# Patient Record
Sex: Female | Born: 1965 | Race: White | Hispanic: No | Marital: Married | Smoking: Never smoker
Health system: Southern US, Community
[De-identification: ages and names within clinical notes are randomized; demographics above are authoritative.]

## PROBLEM LIST (undated history)

## (undated) HISTORY — PX: OTHER SURGICAL HISTORY: SHX169

## (undated) HISTORY — PX: BUNIONECTOMY: SHX129

---

## 2002-07-15 ENCOUNTER — Other Ambulatory Visit: Admission: RE | Admit: 2002-07-15 | Discharge: 2002-07-15 | Payer: Self-pay | Admitting: Obstetrics and Gynecology

## 2003-11-18 ENCOUNTER — Other Ambulatory Visit: Admission: RE | Admit: 2003-11-18 | Discharge: 2003-11-18 | Payer: Self-pay | Admitting: Obstetrics and Gynecology

## 2004-06-20 ENCOUNTER — Ambulatory Visit (HOSPITAL_COMMUNITY): Admission: RE | Admit: 2004-06-20 | Discharge: 2004-06-20 | Payer: Self-pay | Admitting: Obstetrics and Gynecology

## 2005-02-28 ENCOUNTER — Ambulatory Visit (HOSPITAL_COMMUNITY): Admission: RE | Admit: 2005-02-28 | Discharge: 2005-02-28 | Payer: Self-pay | Admitting: Specialist

## 2011-03-17 ENCOUNTER — Ambulatory Visit: Payer: Private Health Insurance - Indemnity | Admitting: Family Medicine

## 2011-03-17 VITALS — BP 128/67 | HR 52 | Temp 98.2°F | Resp 16 | Ht 66.38 in | Wt 138.4 lb

## 2011-03-17 DIAGNOSIS — J329 Chronic sinusitis, unspecified: Secondary | ICD-10-CM

## 2011-03-17 DIAGNOSIS — J029 Acute pharyngitis, unspecified: Secondary | ICD-10-CM

## 2011-03-17 LAB — POCT RAPID STREP A (OFFICE): Rapid Strep A Screen: NEGATIVE

## 2011-03-17 MED ORDER — AZITHROMYCIN 250 MG PO TABS: ORAL_TABLET | ORAL | Status: AC

## 2011-03-17 NOTE — Progress Notes (Signed)
  [facility]:  Office Visit  Chief Complaint:  Chief Complaint  Patient presents with  . Sore Throat    x 2  week  left side   . Ear Fullness    x  10 days   right is worse  . Sinusitis        advil, dayquil sudafed, nyquil    HPI: Stefanie Blankenship is a 46 y.o. female who complains of  Sore throat, earch ache, sinusitis x 2 weeks, was improving but now worse.   History reviewed. No pertinent past medical history. Past Surgical History  Procedure Date  . Bunionectomy   . Tubal adhesion    History reviewed. No pertinent family history. Allergies  Allergen Reactions  . Penicillins     hospitalized  . Erythromycin Rash   Prior to Admission medications   Not on File     ROS: The patient denies fevers, chills, night sweats, unintentional weight loss, chest pain, palpitations, wheezing, dyspnea on exertion, nausea, vomiting, abdominal pain, dysuria, hematuria, melena, numbness, weakness, or tingling.   All other systems have been reviewed and were otherwise negative with the exception of those mentioned in the HPI and as above.    PHYSICAL EXAM: Filed Vitals:   03/17/11 1409  BP: 128/67  Pulse: 52  Temp: 98.2 F (36.8 C)  Resp: 16   Filed Vitals:   03/17/11 1409  Height: 5' 6.38" (1.686 m)  Weight: 138 lb 6.4 oz (62.778 kg)   Body mass index is 22.08 kg/(m^2).  General: Alert, no acute distress HEENT:  Normocephalic, atraumatic, oropharynx patent.Tm normal, + sinus pressure maxillary, + erythematous, normal size  tonsils. Cardiovascular:  Regular rate and rhythm, no rubs murmurs or gallops.  No Carotid bruits, radial pulse intact. No pedal edema.  Respiratory: Clear to auscultation bilaterally.  No wheezes, rales, or rhonchi.  No cyanosis, no use of accessory musculature GI: No organomegaly, abdomen is soft and non-tender, positive bowel sounds.  No masses. Skin: No rashes. Neurologic: Facial musculature symmetric. Psychiatric: Patient is appropriate  throughout our interaction. Lymphatic: No cervical lymphadenopathy Musculoskeletal: Gait intact  LABS: Results for orders placed in visit on 03/17/11  POCT RAPID STREP A (OFFICE)      Component Value Range   Rapid Strep A Screen Negative  Negative       ASSESSMENT/PLAN: Encounter Diagnoses  Name Primary?  . Pharyngitis Yes  . Sinusitis    1. OTC sxs treatment, salt water gargles 2. Rx Z-pack    Benton Tooker PHUONG, DO 03/19/2011 9:10 AM

## 2016-08-16 ENCOUNTER — Ambulatory Visit (INDEPENDENT_AMBULATORY_CARE_PROVIDER_SITE_OTHER): Payer: 59 | Admitting: Internal Medicine

## 2016-08-16 DIAGNOSIS — Z7189 Other specified counseling: Secondary | ICD-10-CM

## 2016-08-16 DIAGNOSIS — Z7184 Encounter for health counseling related to travel: Secondary | ICD-10-CM

## 2016-08-16 DIAGNOSIS — Z9189 Other specified personal risk factors, not elsewhere classified: Secondary | ICD-10-CM | POA: Diagnosis not present

## 2016-08-16 DIAGNOSIS — Z23 Encounter for immunization: Secondary | ICD-10-CM

## 2016-08-16 DIAGNOSIS — Z789 Other specified health status: Secondary | ICD-10-CM

## 2016-08-16 MED ORDER — ACETAZOLAMIDE 125 MG PO TABS: 125.0000 mg | ORAL_TABLET | Freq: Two times a day (BID) | ORAL | 0 refills | Status: DC

## 2016-08-16 MED ORDER — AZITHROMYCIN 500 MG PO TABS: 500.0000 mg | ORAL_TABLET | Freq: Every day | ORAL | 0 refills | Status: DC

## 2016-08-16 NOTE — Progress Notes (Signed)
Subjective:   Stefanie Blankenship is a 51 y.o. female who presents to the Infectious Disease clinic for travel consultation. Planned departure date: August 4th Planned return date: august 16th Countries of travel: [place] Areas in country: urban and rural Accommodations: hotel and camping Purpose of travel: vacation Prior travel out of [place]: yes- [place], [place], and [place]    Objective:   Medications: none    Assessment:   No contraindications to travel. none   Plan:    Pre-travel vaccination = will give tdap, hep A #1, and typhoid vaccine. Discussed that it does not likely need yellow fever vaccine based on hiking trail.  Malaria proph = not needed, winter, not in endemic area  Traveler's diarrhea = gave precautions, and also rx for azithromycin  Altitude sickness prevention = will give diamox 125mg  bid, to start the day prior to ascent until 2 days at altitude. Gave extra tabs in case symptomatic at [place]

## 2017-10-22 ENCOUNTER — Ambulatory Visit (INDEPENDENT_AMBULATORY_CARE_PROVIDER_SITE_OTHER): Payer: 59 | Admitting: Family Medicine

## 2017-10-22 ENCOUNTER — Encounter: Payer: Self-pay | Admitting: Family Medicine

## 2017-10-22 ENCOUNTER — Other Ambulatory Visit: Payer: Self-pay

## 2017-10-22 VITALS — BP 134/73 | HR 60 | Ht 66.38 in | Wt 152.4 lb

## 2017-10-22 DIAGNOSIS — N390 Urinary tract infection, site not specified: Secondary | ICD-10-CM

## 2017-10-22 DIAGNOSIS — R319 Hematuria, unspecified: Secondary | ICD-10-CM

## 2017-10-22 DIAGNOSIS — R350 Frequency of micturition: Secondary | ICD-10-CM | POA: Diagnosis not present

## 2017-10-22 LAB — POCT URINALYSIS DIP (MANUAL ENTRY)
BILIRUBIN UA: NEGATIVE
BILIRUBIN UA: NEGATIVE mg/dL
GLUCOSE UA: NEGATIVE mg/dL
Nitrite, UA: NEGATIVE
Protein Ur, POC: NEGATIVE mg/dL
SPEC GRAV UA: 1.01 (ref 1.010–1.025)
Urobilinogen, UA: 0.2 E.U./dL
pH, UA: 7 (ref 5.0–8.0)

## 2017-10-22 LAB — POC MICROSCOPIC URINALYSIS (UMFC): Mucus: ABSENT

## 2017-10-22 MED ORDER — PHENAZOPYRIDINE HCL 100 MG PO TABS: 100.0000 mg | ORAL_TABLET | Freq: Three times a day (TID) | ORAL | 0 refills | Status: AC | PRN

## 2017-10-22 MED ORDER — NITROFURANTOIN MONOHYD MACRO 100 MG PO CAPS: 100.0000 mg | ORAL_CAPSULE | Freq: Two times a day (BID) | ORAL | 0 refills | Status: AC

## 2017-10-22 NOTE — Progress Notes (Signed)
Subjective:  By signing my name below, I, [name], attest that this documentation has been prepared under the direction and in the presence of [name], MD. Electronically Signed: [name], Scribe. 10/22/2017 , 12:29 PM .  Patient was seen in Room 1 .   Patient ID: Stefanie Blankenship, female    DOB: 1965/05/05, 52 y.o.   MRN: [id number] Chief Complaint  Patient presents with  . Polyuria    feeling as though she is not able to empty bladder, urinating more than usual. Urine looking really cloudy   HPI Stefanie Blankenship is a 51 y.o. female  Patient suspects having an UTI, where she's been having a sensation of not fully emptying her bladder after urinating. She's also noticed cloudy urine; although, she's been traveling the past 2 days and returned yesterday. She hasn't tried any treatments for this. She notes this may be her second UTI in her life. She denies any fever, but does feel a little "funky." She denies chills, nausea, vomiting or difficulty eating/drinking. She is allergic to penicillin and erythromycin per allergy list.   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 She has a dog and 2 cats at home.   There are no active problems to display for this patient.  History reviewed. No pertinent past medical history. Past Surgical History:  Procedure Laterality Date  . BUNIONECTOMY    . tubal adhesion     Allergies  Allergen Reactions  . Penicillins     hospitalized  . Erythromycin Rash   Prior to Admission medications   Medication Sig Start Date End Date Taking? Authorizing Provider  diphenhydrAMINE (BENADRYL) 12.5 MG/5ML elixir Take by mouth 4 (four) times daily as needed.   Yes [provider]   Social History   Socioeconomic History  . Marital status: Single    Spouse name: Not on file  . Number of children: Not on file  . Years of education: Not on file  . Highest education  level: Not on file  Occupational History  . Not on file  Social Needs  . Financial resource strain: Not on file  . Food insecurity:    Worry: Not on file    Inability: Not on file  . Transportation needs:    Medical: Not on file    Non-medical: Not on file  Tobacco Use  . Smoking status: Never Smoker  . Smokeless tobacco: Never Used  Substance and Sexual Activity  . Alcohol use: No  . Drug use: No  . Sexual activity: Not on file  Lifestyle  . Physical activity:    Days per week: Not on file    Minutes per session: Not on file  . Stress: Not on file  Relationships  . Social connections:    Talks on phone: Not on file    Gets together: Not on file    Attends religious service: Not on file    Active member of club or organization: Not on file    Attends meetings of clubs or organizations: Not on file    Relationship status: Not on file  . Intimate partner violence:    Fear of current or ex partner: Not on file    Emotionally abused: Not on file    Physically abused: Not on file    Forced sexual activity: Not on file  Other Topics Concern  . Not on file  Social History Narrative  .  Not on file   Review of Systems  Constitutional: Negative for appetite change, chills, fatigue, fever and unexpected weight change.  Respiratory: Negative for cough.   Gastrointestinal: Negative for abdominal pain, constipation, diarrhea, nausea and vomiting.  Endocrine: Positive for polyuria.  Genitourinary: Positive for frequency.       Cloudy urine  Skin: Negative for rash and wound.  Neurological: Negative for dizziness, weakness and headaches.       Objective:   Physical Exam  Constitutional: She is oriented to person, place, and time. She appears well-developed and well-nourished. No distress.  HENT:  Head: Normocephalic and atraumatic.  Eyes: Pupils are equal, round, and reactive to light. EOM are normal.  Neck: Neck supple.  Cardiovascular: Normal rate.  Pulmonary/Chest:  Effort normal. No respiratory distress.  Abdominal: Soft. There is no tenderness. There is no CVA tenderness.  Musculoskeletal: Normal range of motion.  Neurological: She is alert and oriented to person, place, and time.  Skin: Skin is warm and dry.  Psychiatric: She has a normal mood and affect. Her behavior is normal.  Nursing note and vitals reviewed.   Vitals:   10/22/17 1144  BP: 134/73  Pulse: 60  SpO2: 99%  Weight: 152 lb 6.4 oz (69.1 kg)  Height: 5' 6.38" (1.686 m)   Results for orders placed or performed in visit on 10/22/17  POCT urinalysis dipstick  Result Value Ref Range   Color, UA yellow yellow   Clarity, UA cloudy (A) clear   Glucose, UA negative negative mg/dL   Bilirubin, UA negative negative   Ketones, POC UA negative negative mg/dL   Spec Grav, UA 1.610 9.604 - 1.025   Blood, UA moderate (A) negative   pH, UA 7.0 5.0 - 8.0   Protein Ur, POC negative negative mg/dL   Urobilinogen, UA 0.2 0.2 or 1.0 E.U./dL   Nitrite, UA Negative Negative   Leukocytes, UA Large (3+) (A) Negative  POCT Microscopic Urinalysis ([facility])  Result Value Ref Range   WBC,UR,HPF,POC Too numerous to count  (A) None WBC/hpf   RBC,UR,HPF,POC Few (A) None RBC/hpf   Bacteria Many (A) None, Too numerous to count   Mucus Absent Absent   Epithelial Cells, UR Per Microscopy None None, Too numerous to count cells/hpf        Assessment & Plan:    Stefanie Blankenship is a 52 y.o. female Urinary tract infection with hematuria, site unspecified - Plan: nitrofurantoin, macrocrystal-monohydrate, (MACROBID) 100 MG capsule, Urine Culture, phenazopyridine (PYRIDIUM) 100 MG tablet  Urinary frequency - Plan: POCT urinalysis dipstick, POCT Microscopic Urinalysis ([facility]), nitrofurantoin, macrocrystal-monohydrate, (MACROBID) 100 MG capsule, Urine Culture, phenazopyridine (PYRIDIUM) 100 MG tablet  UTI without signs/symptoms of upper tract infection at this time.  -Start Macrobid, Pyridium if needed, push fluids,  check urine culture, RTC precautions given  Meds ordered this encounter  Medications  . nitrofurantoin, macrocrystal-monohydrate, (MACROBID) 100 MG capsule    Sig: Take 1 capsule (100 mg total) by mouth 2 (two) times daily.    Dispense:  14 capsule    Refill:  0  . phenazopyridine (PYRIDIUM) 100 MG tablet    Sig: Take 1 tablet (100 mg total) by mouth 3 (three) times daily as needed for pain.    Dispense:  10 tablet    Refill:  0   Patient Instructions    Start Macrobid twice per day for urinary tract infection.  Make sure to drink plenty of fluids.  Pyridium prescribed if needed for urinary symptoms.  I will  check a urine culture and let you know if any changes needed to your antibiotic regimen.  Thank you for coming in today.  Return to the clinic or go to the nearest emergency room if any of your symptoms worsen or new symptoms occur.   Urinary Tract Infection, Adult A urinary tract infection (UTI) is an infection of any part of the urinary tract. The urinary tract includes the:  Kidneys.  Ureters.  Bladder.  Urethra.  These organs make, store, and get rid of pee (urine) in the body. Follow these instructions at home:  Take over-the-counter and prescription medicines only as told by your doctor.  If you were prescribed an antibiotic medicine, take it as told by your doctor. Do not stop taking the antibiotic even if you start to feel better.  Avoid the following drinks: ? Alcohol. ? Caffeine. ? Tea. ? Carbonated drinks.  Drink enough fluid to keep your pee clear or pale yellow.  Keep all follow-up visits as told by your doctor. This is important.  Make sure to: ? Empty your bladder often and completely. Do not to hold pee for long periods of time. ? Empty your bladder before and after sex. ? Wipe from front to back after a bowel movement if you are female. Use each tissue one time when you wipe. Contact a doctor if:  You have back pain.  You have a  fever.  You feel sick to your stomach (nauseous).  You throw up (vomit).  Your symptoms do not get better after 3 days.  Your symptoms go away and then come back. Get help right away if:  You have very bad back pain.  You have very bad lower belly (abdominal) pain.  You are throwing up and cannot keep down any medicines or water. This information is not intended to replace advice given to you by your health care provider. Make sure you discuss any questions you have with your health care provider. Document Released: 06/26/2007 Document Revised: 06/15/2015 Document Reviewed: 11/28/2014 Elsevier Interactive Patient Education  [employer].    If you have lab work done today you will be contacted with your lab results within the next 2 weeks.  If you have not heard from [place] then please contact us. The fastest way to get your results is to register for My Chart.   IF you received an x-ray today, you will receive an invoice from [facility]. Please contact [facility] at [phone] with questions or concerns regarding your invoice.   IF you received labwork today, you will receive an invoice from [place]. Please contact [facility] at [phone] with questions or concerns regarding your invoice.   Our billing staff will not be able to assist you with questions regarding bills from these companies.  You will be contacted with the lab results as soon as they are available. The fastest way to get your results is to activate your My Chart account. Instructions are located on the last page of this paperwork. If you have not heard from [place] regarding the results in 2 weeks, please contact this office.      I personally performed the services described in this documentation, which was scribed in my presence. The recorded information has been reviewed and considered for accuracy and completeness, addended by me as needed, and agree with information above.  Signed,    [name], MD [facility].  10/23/17 1:00 PM

## 2017-10-22 NOTE — Patient Instructions (Addendum)
  Start Macrobid twice per day for urinary tract infection.  Make sure to drink plenty of fluids.  Pyridium prescribed if needed for urinary symptoms.  I will check a urine culture and let you know if any changes needed to your antibiotic regimen.  Thank you for coming in today.  Return to the clinic or go to the nearest emergency room if any of your symptoms worsen or new symptoms occur.   Urinary Tract Infection, Adult A urinary tract infection (UTI) is an infection of any part of the urinary tract. The urinary tract includes the:  Kidneys.  Ureters.  Bladder.  Urethra.  These organs make, store, and get rid of pee (urine) in the body. Follow these instructions at home:  Take over-the-counter and prescription medicines only as told by your doctor.  If you were prescribed an antibiotic medicine, take it as told by your doctor. Do not stop taking the antibiotic even if you start to feel better.  Avoid the following drinks: ? Alcohol. ? Caffeine. ? Tea. ? Carbonated drinks.  Drink enough fluid to keep your pee clear or pale yellow.  Keep all follow-up visits as told by your doctor. This is important.  Make sure to: ? Empty your bladder often and completely. Do not to hold pee for long periods of time. ? Empty your bladder before and after sex. ? Wipe from front to back after a bowel movement if you are female. Use each tissue one time when you wipe. Contact a doctor if:  You have back pain.  You have a fever.  You feel sick to your stomach (nauseous).  You throw up (vomit).  Your symptoms do not get better after 3 days.  Your symptoms go away and then come back. Get help right away if:  You have very bad back pain.  You have very bad lower belly (abdominal) pain.  You are throwing up and cannot keep down any medicines or water. This information is not intended to replace advice given to you by your health care provider. Make sure you discuss any questions you  have with your health care provider. Document Released: 06/26/2007 Document Revised: 06/15/2015 Document Reviewed: 11/28/2014 Elsevier Interactive Patient Education  [employer].    If you have lab work done today you will be contacted with your lab results within the next 2 weeks.  If you have not heard from [place] then please contact us. The fastest way to get your results is to register for My Chart.   IF you received an x-ray today, you will receive an invoice from [facility]. Please contact [facility] at [phone] with questions or concerns regarding your invoice.   IF you received labwork today, you will receive an invoice from [place]. Please contact [facility] at [phone] with questions or concerns regarding your invoice.   Our billing staff will not be able to assist you with questions regarding bills from these companies.  You will be contacted with the lab results as soon as they are available. The fastest way to get your results is to activate your My Chart account. Instructions are located on the last page of this paperwork. If you have not heard from [place] regarding the results in 2 weeks, please contact this office.

## 2017-10-24 LAB — URINE CULTURE

## 2018-01-01 ENCOUNTER — Other Ambulatory Visit: Payer: Self-pay | Admitting: Family Medicine

## 2018-01-01 DIAGNOSIS — Z1231 Encounter for screening mammogram for malignant neoplasm of breast: Secondary | ICD-10-CM

## 2018-02-09 ENCOUNTER — Ambulatory Visit: Payer: 59

## 2018-02-16 ENCOUNTER — Ambulatory Visit
Admission: RE | Admit: 2018-02-16 | Discharge: 2018-02-16 | Disposition: A | Discharge status: Home / Self Care | Payer: 59 | Source: Ambulatory Visit | Attending: Family Medicine | Admitting: Family Medicine

## 2018-02-16 DIAGNOSIS — Z1231 Encounter for screening mammogram for malignant neoplasm of breast: Secondary | ICD-10-CM

## 2020-06-12 IMAGING — MG DIGITAL SCREENING BILATERAL MAMMOGRAM WITH TOMO AND CAD
8 series · 9 of 24 positions shown · non-contrast
Comparison: None.

CLINICAL DATA: Screening.

EXAM:
DIGITAL SCREENING BILATERAL MAMMOGRAM WITH TOMO AND CAD

[R MLO synth-2D]
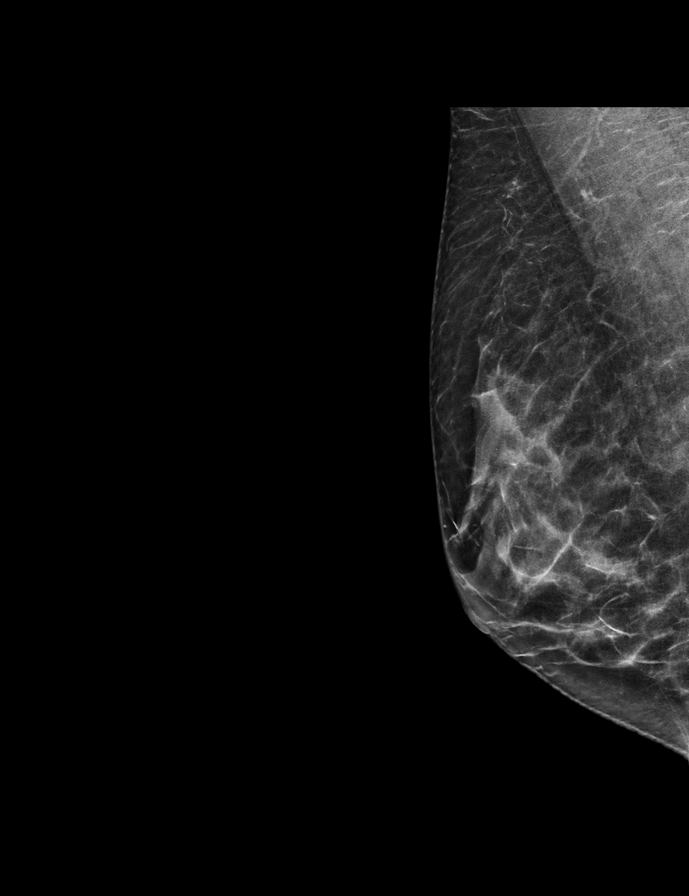

[R CC synth-2D]
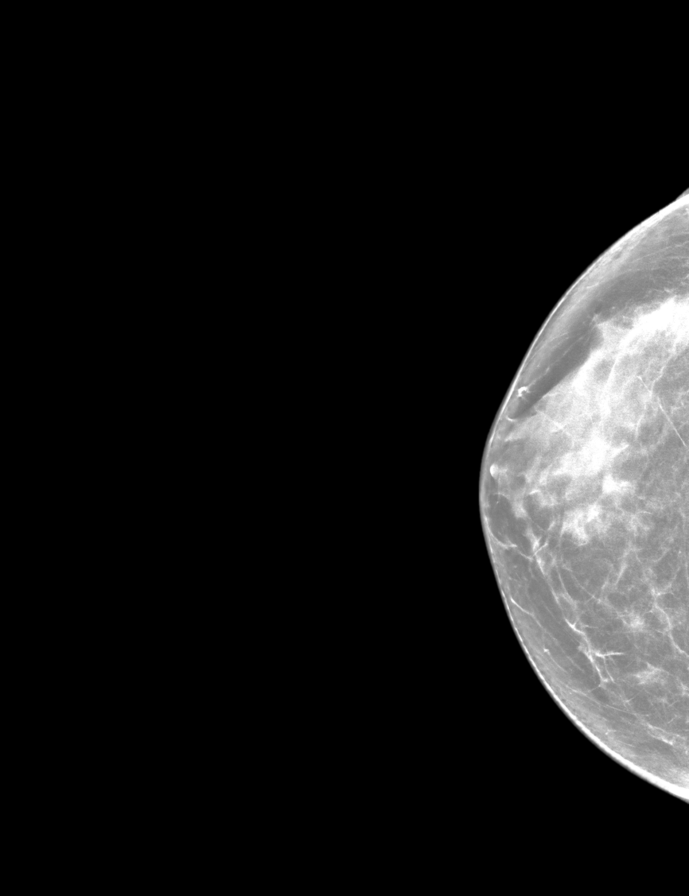

[L CC synth-2D]
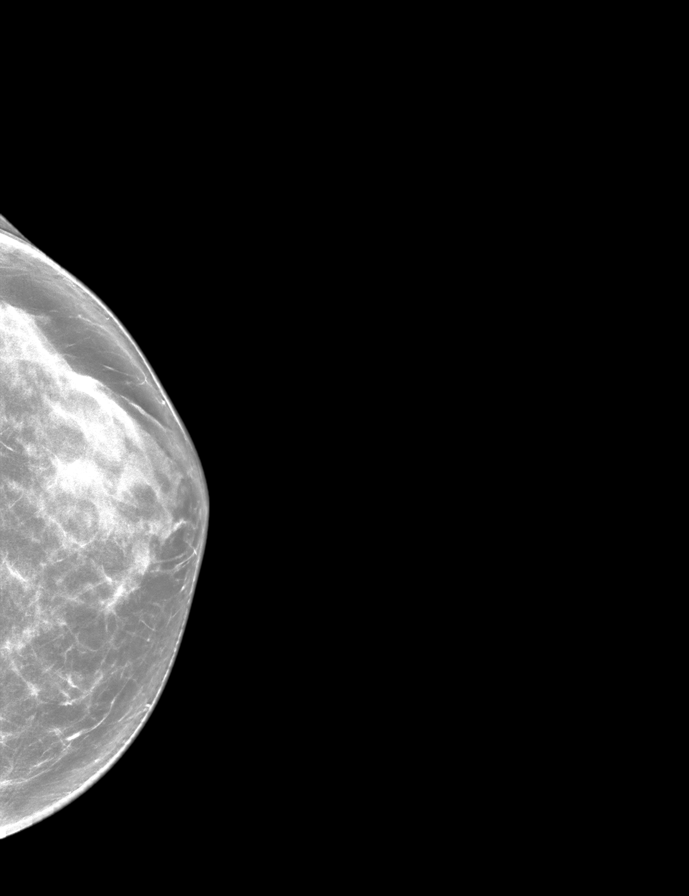

[L MLO synth-2D]
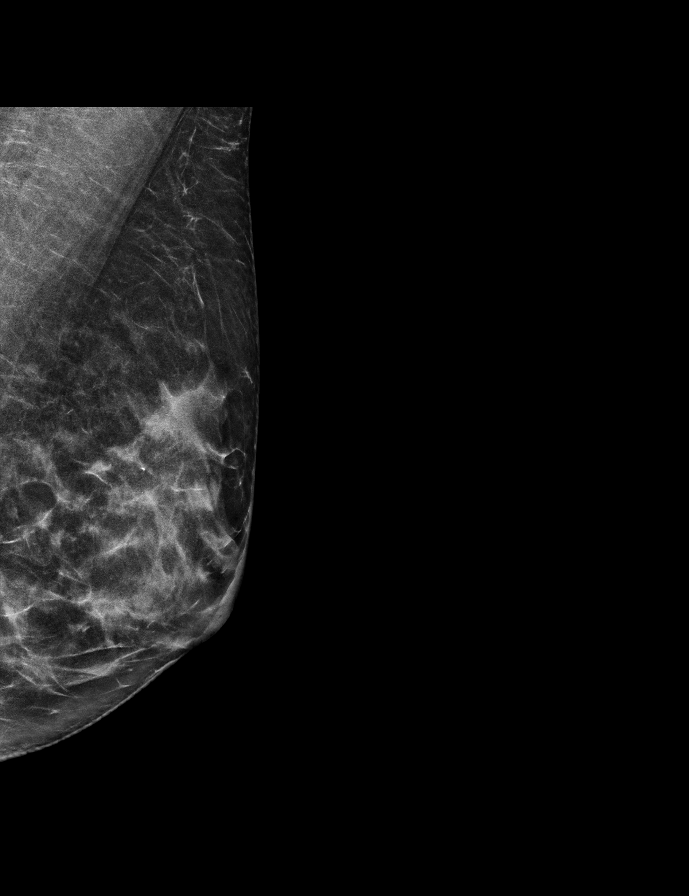

[L MLO tomo · 2 of 57 frames shown]
[frame 19/57]
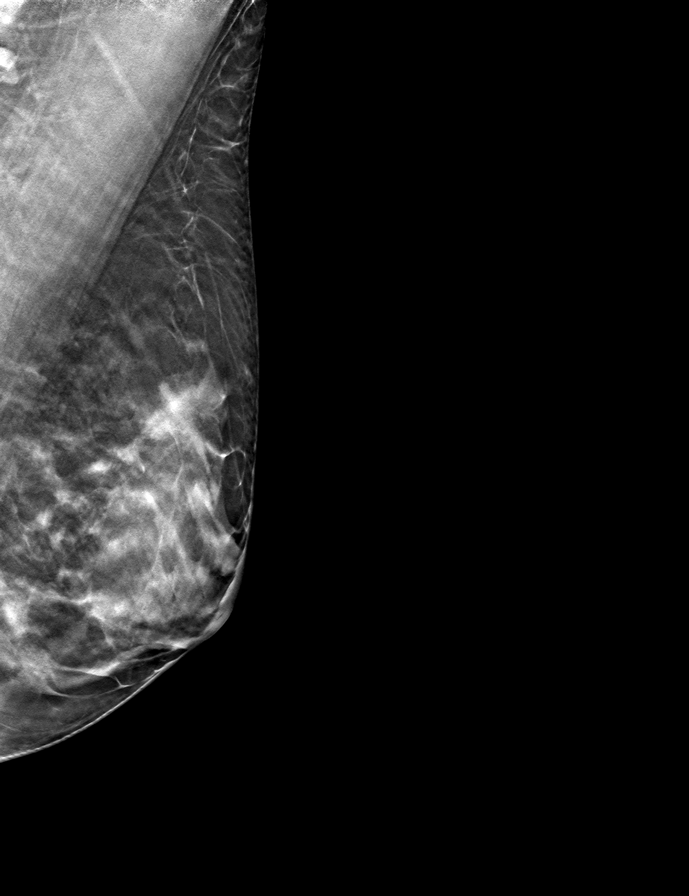
[frame 29/57]
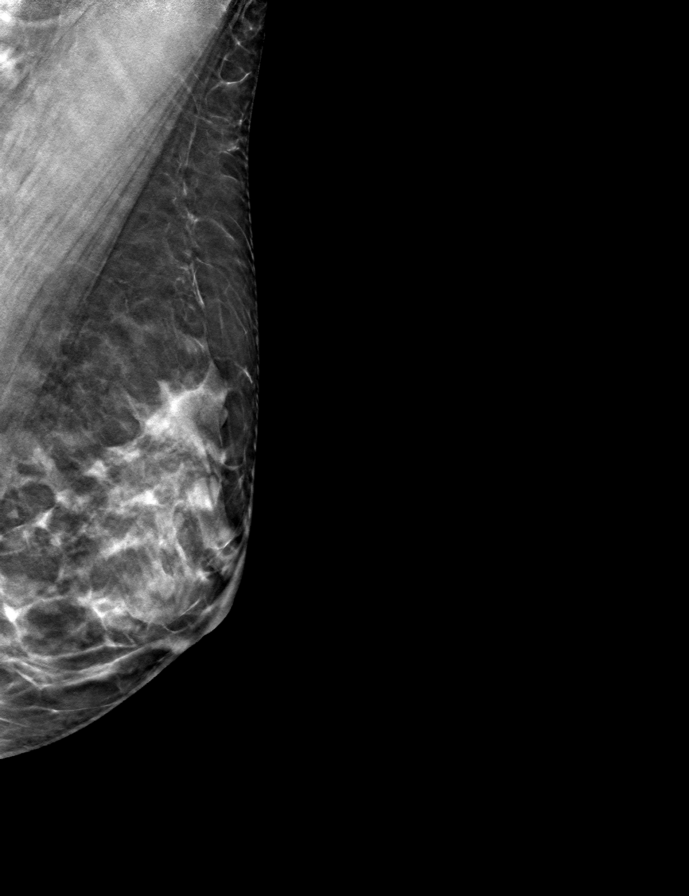

[L CC tomo · tomo slice 32/63.0]
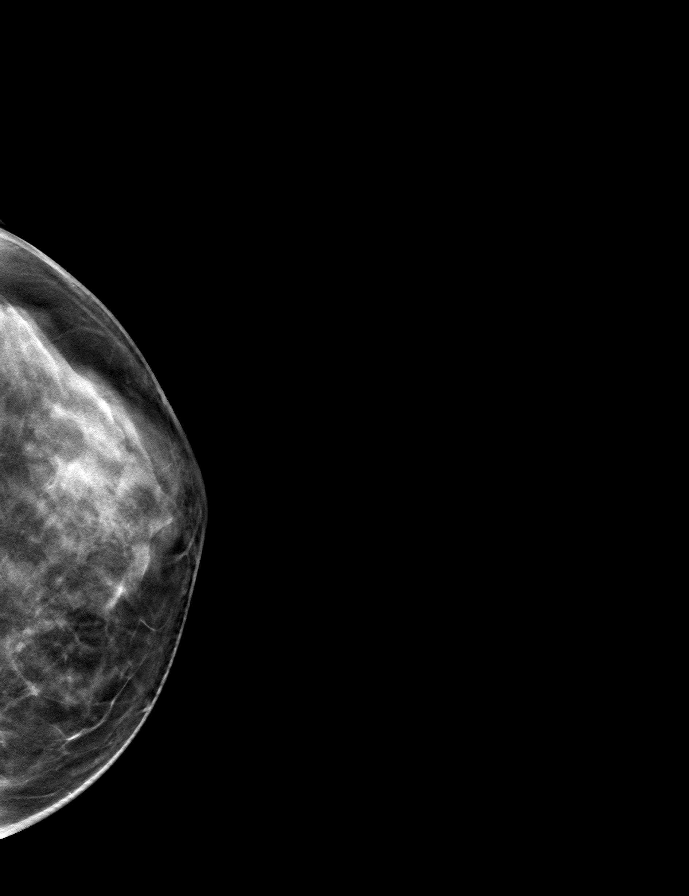

[R MLO tomo · tomo slice 29/56.0]
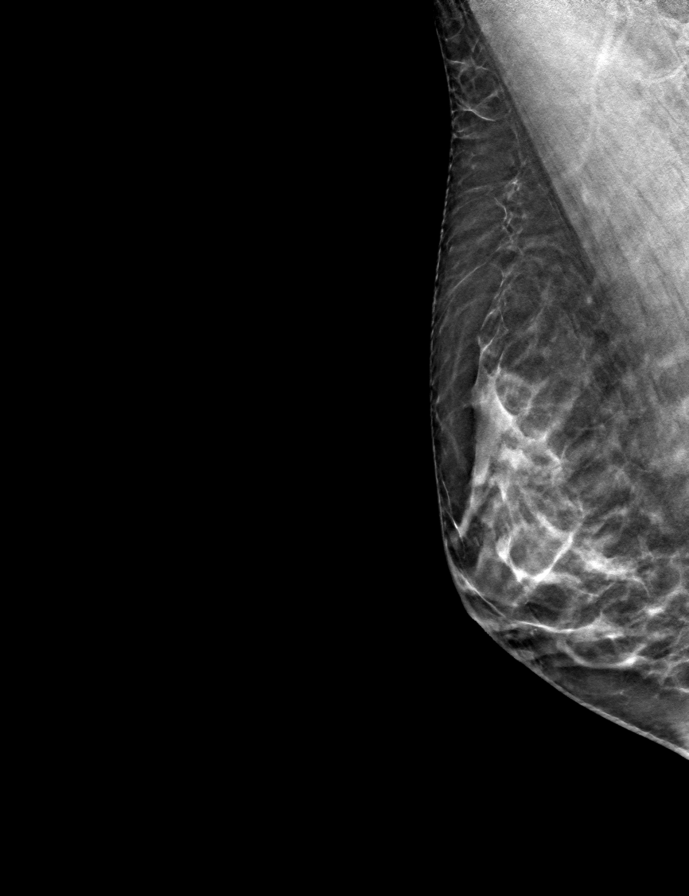

[R CC tomo · tomo slice 31/62.0]
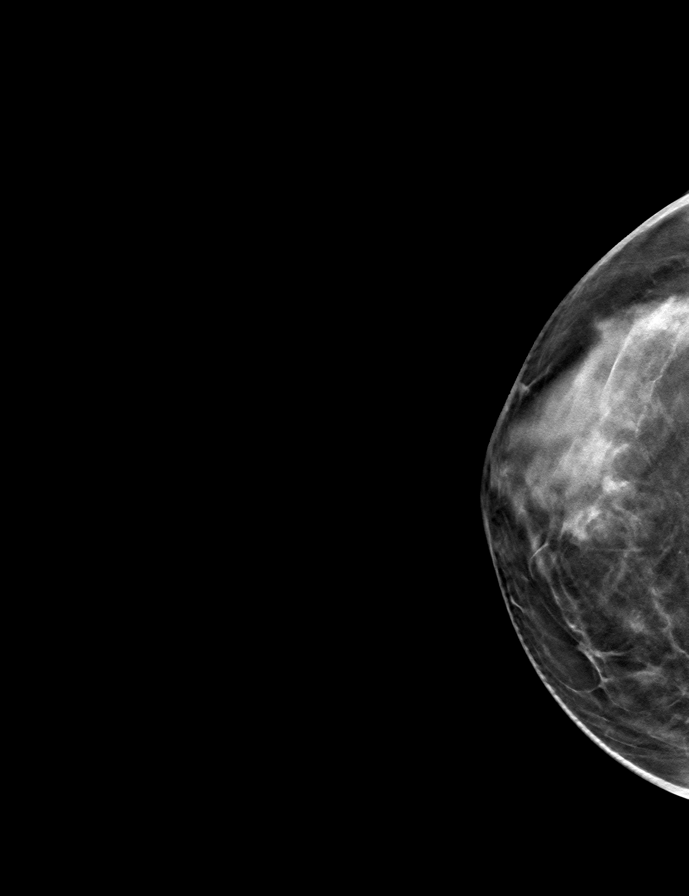

[9 of 24 positions shown; findings below may reference images not displayed]

ACR Breast Density Category c: The breast tissue is heterogeneously
dense, which may obscure small masses
FINDINGS: There are no findings suspicious for malignancy. Images were
processed with CAD.
IMPRESSION: No mammographic evidence of malignancy. A result letter of this
screening mammogram will be mailed directly to the patient.

RECOMMENDATION:
Screening mammogram in one year. (Code:EM-2-IHY)

BI-RADS CATEGORY  1: Negative.

## 2021-08-15 ENCOUNTER — Ambulatory Visit (INDEPENDENT_AMBULATORY_CARE_PROVIDER_SITE_OTHER): Payer: 59

## 2021-08-15 ENCOUNTER — Ambulatory Visit: Payer: 59

## 2021-08-15 ENCOUNTER — Ambulatory Visit (INDEPENDENT_AMBULATORY_CARE_PROVIDER_SITE_OTHER): Payer: 59 | Admitting: Podiatry

## 2021-08-15 DIAGNOSIS — M7751 Other enthesopathy of right foot: Secondary | ICD-10-CM

## 2021-08-15 NOTE — Progress Notes (Signed)
   Chief Complaint  Patient presents with   Foot Pain    ft pain bottom of R Ft    HPI: 56 y.o. female presenting today as a new patient for evaluation of pain and tenderness to the right forefoot this been going on for a few months now.  Patient states that she recalls stepping wrong and irritating her forefoot.  She does have history of bunionectomy surgery about 20 years prior.  She says that rest and certain shoes do help alleviate some of her symptoms.  She presents for further treatment and evaluation  No past medical history on file.  Past Surgical History:  Procedure Laterality Date   BUNIONECTOMY     tubal adhesion      Allergies  Allergen Reactions   Penicillins     hospitalized   Erythromycin Rash     Physical Exam: General: The patient is alert and oriented x3 in no acute distress.  Dermatology: Skin is warm, dry and supple bilateral lower extremities. Negative for open lesions or macerations.  Vascular: Palpable pedal pulses bilaterally. Capillary refill within normal limits.  Negative for any significant edema or erythema  Neurological: Light touch and protective threshold grossly intact  Musculoskeletal Exam: No pedal deformities noted.  There is significant tenderness and pain with palpation and range of motion of the third MTP right foot  Radiographic Exam:  Normal osseous mineralization. Joint spaces preserved. No fracture/dislocation/boney destruction.  K wire fixation noted to the distal metatarsals bilateral which appear to be stable and intact  Assessment: 1. PSxHx bunionectomy bilateral. 71yrs prior 2.  Third MTP capsulitis RT   Plan of Care:  1. Patient evaluated. X-Rays reviewed.  2.  Discussed different conservative treatment options.  For now we are going to pursue very conservative treatment including rest ice and thick soled running shoes that limit motion of the forefoot for the next 4-6 weeks 3.  Postsurgical shoe was offered but declined.   Additional more aggressive modalities including Medrol Dosepak, prescription NSAIDs were offered for the patient.  If she does not get any significant improvement over the next few weeks she can call into the office and we will send those to the pharmacy 4.  Return to clinic as needed  *Veterinarian.  Currently works leadership training   Felecia Shelling, DPM [facility]  Dr. Felecia Shelling, DPM    [address] [place], [place] [address]                Office [phone]  Fax [phone]

## 2023-11-07 NOTE — Progress Notes (Signed)
 Subjective Patient ID: Stefanie Blankenship is a 58 y.o. female.    Patient here for evaluation of right ear pain, right sided headache, and pain that radiates down right side of neck.  She states it started 4-5 days ago.  She states the headache has a pulsatile quality and she can also hear a pulsatile sound in right ear, sometimes stronger with lying down or turning the head.  Patient states she has been taking medication for the headache.  Headache finally went away last night, but is back today.  Patient denies chest pain, shortness of breath, dizziness, sweats, or palpitations.  Patient denies history of HTN, but has had a few episodes of elevated blood pressure in the past.     History provided by:  Patient Language interpreter used: No   Headache Associated symptoms: ear pain and neck pain   Associated symptoms: no abdominal pain, no back pain, no congestion, no cough, no diarrhea, no dizziness, no fatigue, no fever, no hearing loss, no nausea, no numbness, no photophobia, no sinus pressure, no sore throat and no vomiting   Earache  Associated symptoms include headaches and neck pain. Pertinent negatives include no abdominal pain, coughing, diarrhea, ear discharge, hearing loss, rash, rhinorrhea, sore throat or vomiting.    Review of Systems  Constitutional:  Negative for activity change, appetite change, chills, diaphoresis, fatigue and fever.  HENT:  Positive for ear pain and tinnitus (pulsatile in right ear). Negative for congestion, ear discharge, hearing loss, rhinorrhea, sinus pressure, sinus pain, sore throat and trouble swallowing.   Eyes:  Negative for photophobia and visual disturbance.  Respiratory:  Negative for cough, shortness of breath and wheezing.   Cardiovascular:  Negative for chest pain.  Gastrointestinal:  Negative for abdominal pain, diarrhea, nausea and vomiting.  Musculoskeletal:  Positive for neck pain. Negative for back pain.  Skin:  Negative for rash.  Neurological:   Positive for headaches. Negative for dizziness and numbness.    Patient History  Allergies: Allergies  Allergen Reactions  . Penicillins Hives, Rash and Unknown    hospitalized  . Erythromycin Rash  . Latex Rash and Unknown    History reviewed. No pertinent past medical history. History reviewed. No pertinent surgical history. Social History   Socioeconomic History  . Marital status: Married    Spouse name: Not on file  . Number of children: Not on file  . Years of education: Not on file  . Highest education level: Not on file  Occupational History  . Not on file  Tobacco Use  . Smoking status: Never  . Smokeless tobacco: Never  Substance and Sexual Activity  . Alcohol use: Yes  . Drug use: Not on file  . Sexual activity: Not on file  Other Topics Concern  . Not on file  Social History Narrative  . Not on file   History reviewed. No pertinent family history. No current outpatient medications on file prior to visit.   No current facility-administered medications on file prior to visit.    Objective  Vitals:   11/07/23 1036 11/07/23 1100  BP: (!) 171/80 (!) 163/98  Pulse: 67   Resp: 16   Temp: 36.6 C (97.8 F)   TempSrc: Oral   SpO2: 100%   Weight: 64.5 kg   Height: 5' 6   PainSc:   4                No results found.  Physical Exam Vitals and nursing note reviewed.  Constitutional:  General: She is not in acute distress.    Appearance: Normal appearance. She is not ill-appearing, toxic-appearing or diaphoretic.  HENT:     Head: Normocephalic and atraumatic.     Right Ear: Tympanic membrane, ear canal and external ear normal.     Left Ear: Tympanic membrane, ear canal and external ear normal.     Nose: Nose normal.     Mouth/Throat:     Mouth: Mucous membranes are moist.     Pharynx: Oropharynx is clear.  Eyes:     Conjunctiva/sclera: Conjunctivae normal.     Pupils: Pupils are equal, round, and reactive to light.  Neck:      Thyroid: No thyroid mass, thyromegaly or thyroid tenderness.     Vascular: No carotid bruit.  Cardiovascular:     Rate and Rhythm: Normal rate and regular rhythm.     Pulses: Normal pulses.     Heart sounds: Normal heart sounds.  Pulmonary:     Effort: Pulmonary effort is normal. No respiratory distress.     Breath sounds: Normal breath sounds. No wheezing.  Musculoskeletal:     Cervical back: Normal range of motion and neck supple. No pain with movement, spinous process tenderness or muscular tenderness. Normal range of motion.  Lymphadenopathy:     Cervical: No cervical adenopathy.     Right cervical: No superficial, deep or posterior cervical adenopathy.    Left cervical: No superficial, deep or posterior cervical adenopathy.  Skin:    General: Skin is warm.  Neurological:     Mental Status: She is alert.     Results for orders placed or performed in visit on 11/07/23  ECG 12 lead   Narrative   ECG Interpretation:  Rhythm: Normal sinus rhythm at 52 beats per minute Axis: Normal axis  Intervals: Normal PR interval QRS Complex: Normal ST Segment: Normal ST-T segments QT Interval: Normal  Compared with prior: No, None Available.   Summary of Clinical Condition:  normal ECG.    Interpretation by Eva Rase, PA        Procedures MDM:     1 Undiagnosed new problem with uncertain prognosis     Explanation of Medical Decision Making and variances from expected care:  Patient has elevated blood pressure.  Otherwise normal vitals.  ECG without significant findings.  I discussed with patient that based on her symptoms and exam I have concerns.  I advised that imaging is indicated.  I advised her to go to the Emergency Room for further evaluation    Risk:: High            Assessment/Plan Diagnoses and all orders for this visit:  Elevated blood pressure reading -     ECG 12 lead  Nonintractable headache, unspecified chronicity pattern, unspecified headache  type -     ECG 12 lead  Right ear pain -     ECG 12 lead  Neck pain on right side  Pulsatile tinnitus of right ear     Disposition Status: Emergency Department  Patient Instructions  We have discussed different possible causes of symptoms of right sided ear ache, neck pain, and headache with pulsatile tinnitus ECG without significant findings Based on symptoms, further evaluation in the ER is recommended for imaging   Progress note signed by Eva Rase, PA on 11/07/23 at 11:52 AM

## 2023-11-07 NOTE — Progress Notes (Signed)
 Stefanie Blankenship is a 58 y.o. female Presents with right side ear pain and headache. Onset Monday. Pt stated the pain goes down her neck also.

## 2023-11-10 ENCOUNTER — Other Ambulatory Visit: Payer: Self-pay

## 2023-11-10 ENCOUNTER — Emergency Department (HOSPITAL_BASED_OUTPATIENT_CLINIC_OR_DEPARTMENT_OTHER)

## 2023-11-10 ENCOUNTER — Emergency Department (HOSPITAL_BASED_OUTPATIENT_CLINIC_OR_DEPARTMENT_OTHER)
Admission: EM | Admit: 2023-11-10 | Discharge: 2023-11-10 | Disposition: A | Discharge status: Home / Self Care | Attending: Emergency Medicine | Admitting: Emergency Medicine

## 2023-11-10 DIAGNOSIS — R030 Elevated blood-pressure reading, without diagnosis of hypertension: Secondary | ICD-10-CM | POA: Insufficient documentation

## 2023-11-10 DIAGNOSIS — R519 Headache, unspecified: Secondary | ICD-10-CM | POA: Diagnosis not present

## 2023-11-10 DIAGNOSIS — H9311 Tinnitus, right ear: Secondary | ICD-10-CM

## 2023-11-10 LAB — BASIC METABOLIC PANEL WITH GFR
Anion gap: 9 (ref 5–15)
BUN: 21 mg/dL — ABNORMAL HIGH (ref 6–20)
CO2: 27 mmol/L (ref 22–32)
Calcium: 9.9 mg/dL (ref 8.9–10.3)
Chloride: 103 mmol/L (ref 98–111)
Creatinine, Ser: 0.83 mg/dL (ref 0.44–1.00)
GFR, Estimated: >60 mL/min (ref >60)
Glucose, Bld: 79 mg/dL (ref 70–99)
Potassium: 3.9 mmol/L (ref 3.5–5.1)
Sodium: 138 mmol/L (ref 135–145)

## 2023-11-10 LAB — CBC
HCT: 37 % (ref 36.0–46.0)
Hemoglobin: 12.7 g/dL (ref 12.0–15.0)
MCH: 31.8 pg (ref 26.0–34.0)
MCHC: 34.3 g/dL (ref 30.0–36.0)
MCV: 92.5 fL (ref 80.0–100.0)
Platelets: 231 K/uL (ref 150–400)
RBC: 4 MIL/uL (ref 3.87–5.11)
RDW: 11.9 % (ref 11.5–15.5)
WBC: 6.1 K/uL (ref 4.0–10.5)
nRBC: 0 % (ref 0.0–0.2)

## 2023-11-10 MED ORDER — IOHEXOL 350 MG/ML SOLN
75.0000 mL | Freq: Once | INTRAVENOUS | Status: AC | PRN
  Administered 2023-11-10: 75 mL via INTRAVENOUS

## 2023-11-10 NOTE — Discharge Instructions (Addendum)
 Please follow-up with neurology in regards to today's imaging and your persistent symptoms.  They should call you to schedule appointment however if you have not heard from them please call to schedule yourself.  I have also referred you to ENT for tinnitus you will have to call to schedule appointment.  Return to emergency room with new or worsening symptoms.

## 2023-11-10 NOTE — ED Provider Notes (Signed)
 [place] EMERGENCY DEPARTMENT AT [facility] Provider Note   CSN: [id number] Arrival date & time: 11/10/23  1502     Patient presents with: No chief complaint on file.   Stefanie Blankenship is a 58 y.o. female patient with noncontributory past medical history is reporting to emergency room with complaint of approximately 2 weeks of right sided headache and earache.  She is primarily describing this as a tinnitus sensation that comes and goes.  She denies any injury trauma or fall.  She does not have any headache history.  She has been seen for ear pain urgent care and told that her ear appears normal.  She has not had any hearing change with this.   HPI     Prior to Admission medications   Medication Sig Start Date End Date Taking? Authorizing Provider  diphenhydrAMINE (BENADRYL) 12.5 MG/5ML elixir Take by mouth 4 (four) times daily as needed.    [provider]  nitrofurantoin , macrocrystal-monohydrate, (MACROBID ) 100 MG capsule Take 1 capsule (100 mg total) by mouth 2 (two) times daily. 10/22/17   Levora Reyes SAUNDERS, MD  phenazopyridine  (PYRIDIUM ) 100 MG tablet Take 1 tablet (100 mg total) by mouth 3 (three) times daily as needed for pain. 10/22/17   Levora Reyes SAUNDERS, MD    Allergies: Penicillins and Erythromycin    Review of Systems  HENT:  Positive for ear pain.     Updated Vital Signs BP (!) 164/76   Pulse (!) 59   Temp 97.7 F (36.5 C)   Resp 18   LMP 02/24/2011   SpO2 100%   Physical Exam Vitals and nursing note reviewed.  Constitutional:      General: She is not in acute distress.    Appearance: She is not toxic-appearing.  HENT:     Head: Normocephalic and atraumatic.     Mouth/Throat:     Comments: Normal appearance of tympanic membrane and external auditory ear canal. Eyes:     General: No scleral icterus.    Conjunctiva/sclera: Conjunctivae normal.  Cardiovascular:     Rate and Rhythm: Normal rate and regular rhythm.     Pulses: Normal pulses.      Heart sounds: Normal heart sounds.  Pulmonary:     Effort: Pulmonary effort is normal. No respiratory distress.     Breath sounds: Normal breath sounds.  Abdominal:     General: Abdomen is flat. Bowel sounds are normal.     Palpations: Abdomen is soft.     Tenderness: There is no abdominal tenderness.  Skin:    General: Skin is warm and dry.     Findings: No lesion.  Neurological:     General: No focal deficit present.     Mental Status: She is alert and oriented to person, place, and time. Mental status is at baseline.     Comments: Patient has nonfocal neurological exam.     (all labs ordered are listed, but only abnormal results are displayed) Labs Reviewed  BASIC METABOLIC PANEL WITH GFR - Abnormal; Notable for the following components:      Result Value   BUN 21 (*)    All other components within normal limits  CBC    EKG: None  Radiology: CT ANGIO HEAD NECK W WO CM Result Date: 11/10/2023 EXAM: CTA Head and Neck with Intravenous Contrast. CT Head without Contrast. CLINICAL HISTORY: Vertigo, central; Pulsatile tinnitus and neck pain x1 week. Headache and pain in right ear since last week. UC says ear  looks okay. Pain continues today intermittently-going down neck. TECHNIQUE: Axial CTA images of the head and neck performed with intravenous contrast. MIP reconstructed images were created and reviewed. Axial computed tomography images of the head/brain performed without intravenous contrast. Note: Per PQRS, the description of internal carotid artery percent stenosis, including 0 percent or normal exam, is based on [place] American Symptomatic Carotid Endarterectomy Trial (NASCET) criteria. Dose reduction technique was used including one or more of the following: automated exposure control, adjustment of mA and kV according to patient size, and/or iterative reconstruction. CONTRAST: With; 75mL (iohexol (OMNIPAQUE) 350 MG/ML injection 75 mL IOHEXOL 350 MG/ML SOLN) COMPARISON:  None provided. FINDINGS: BRAIN: No acute intraparenchymal hemorrhage. No mass lesion. No CT evidence for acute territorial infarct. No midline shift or extra-axial collection. VENTRICLES: No hydrocephalus. ORBITS: The orbits are unremarkable. SINUSES AND MASTOIDS: The paranasal sinuses and mastoid air cells are clear. COMMON CAROTID ARTERIES: No significant stenosis. No dissection or occlusion. INTERNAL CAROTID ARTERIES: No stenosis by NASCET criteria. No evidence of dissection or occlusion. Mild irregularity of the left ICA at C1-C2 with beaded appearance on reformatted imaging. VERTEBRAL ARTERIES: No significant stenosis. No dissection or occlusion. ANTERIOR CEREBRAL ARTERIES: No significant stenosis. No occlusion. No aneurysm. MIDDLE CEREBRAL ARTERIES: No significant stenosis. No occlusion. No aneurysm. POSTERIOR CEREBRAL ARTERIES: No significant stenosis. No occlusion. No aneurysm. BASILAR ARTERY: No significant stenosis. No occlusion. No aneurysm. SOFT TISSUES: No acute finding. No masses or lymphadenopathy. BONES: Left-eccentric C5-C6 degenerative disc disease. No evidence of acute abnormality. IMPRESSION: 1. No evidence of acute intracranial abnormality. 2. No evidence of significant stenosis, aneurysmal dilatation, or dissection involving the arteries of the head and neck. 3. Mild irregularity of the left ICA at C1-C2 with beaded appearance, which can be seen with fibromuscular dysplasia. Electronically signed by: Gilmore Molt MD 11/10/2023 07:02 PM EDT RP Workstation: [id number]     Procedures   Medications Ordered in the ED  iohexol (OMNIPAQUE) 350 MG/ML injection 75 mL (75 mLs Intravenous Contrast Given 11/10/23 1815)                                    Medical Decision Making Amount and/or Complexity of Data Reviewed Labs: ordered. Radiology: ordered.  Risk Prescription drug management.   This patient presents to the ED for concern of headache, this involves an extensive number of  treatment options, and is a complaint that carries with it a high risk of complications and morbidity.  The differential diagnosis includes tension headache, migraine, intracranial mass, intracranial hemorrhage, intracranial infection including meningitis vs encephalitis, trigeminal neuralgia, AVM, sinusitis, cerebral aneurysm, muscular headache, cavernous sinus thrombosis, carotid artery dissection.   Lab Tests:  I personally interpreted labs.  The pertinent results include: CBC, BMP is unremarkable   Imaging Studies ordered:  I ordered imaging studies including head CT angio head and neck I independently visualized and interpreted imaging which showed no acute intercranial abnormality, no evidence of significant stenosis, aneurysm or dissection of arteries in head and neck.  There was mild irregularity to left ICA. I agree with the radiologist interpretation    Problem List / ED Course / Critical interventions / Medication management  Patient reporting with complaint of right-sided headache and she describes a pulsing sensation in her right ear.  On my exam there is no sign of otitis media or otitis externa.  She is hemodynamically stable although mildly hypertensive.  She is well-appearing.  She has nonfocal neurological exam.  She does not have any popping sensation that would suggest TMJ dysfunction.  She denies any regular salicylate use, frequent NSAID use or diuretic use.  Her CT scan shows no acute findings.  I did offer further workup here however she is requesting discharge. I have reviewed the patients home medicines and have made adjustments as needed. Patient will follow-up with neurology as well as ENT.  She was given return precautions.  She will continue to monitor her blood pressure as it is mildly elevated here.  She will return to ER with new or worsening symptoms.        Final diagnoses:  Bad headache  Elevated blood pressure reading    ED Discharge Orders      None          Shermon Warren LOISE RIGGERS 11/10/23 2107    Cottie Donnice PARAS, MD 11/10/23 [phone]

## 2023-11-10 NOTE — ED Triage Notes (Addendum)
 Headache and pain in right ear since last week. UC says ear looks okay. They recommended imaging, but she decided to wait and see. Pain continues today intermittently-going down neck. No hearing changes, no discharge. Denies N/V, no fevers.

## 2024-02-05 ENCOUNTER — Other Ambulatory Visit: Payer: Self-pay | Admitting: Obstetrics and Gynecology

## 2024-02-05 DIAGNOSIS — R928 Other abnormal and inconclusive findings on diagnostic imaging of breast: Secondary | ICD-10-CM

## 2024-02-12 ENCOUNTER — Ambulatory Visit
Admission: RE | Admit: 2024-02-12 | Discharge: 2024-02-12 | Disposition: A | Discharge status: Home / Self Care | Source: Ambulatory Visit | Attending: Obstetrics and Gynecology | Admitting: Obstetrics and Gynecology

## 2024-02-12 DIAGNOSIS — R928 Other abnormal and inconclusive findings on diagnostic imaging of breast: Secondary | ICD-10-CM
